# Patient Record
Sex: Male | Born: 1975 | Race: White | Marital: Married | Smoking: Never smoker
Health system: Southern US, Community
[De-identification: ages and names within clinical notes are randomized; demographics above are authoritative.]

## PROBLEM LIST (undated history)

## (undated) HISTORY — PX: KNEE ARTHROSCOPY W/ ACL RECONSTRUCTION: SHX1858

---

## 2013-09-03 ENCOUNTER — Ambulatory Visit (INDEPENDENT_AMBULATORY_CARE_PROVIDER_SITE_OTHER): Payer: BC Managed Care – PPO | Admitting: Family Medicine

## 2013-09-03 ENCOUNTER — Encounter: Payer: Self-pay | Admitting: Family Medicine

## 2013-09-03 ENCOUNTER — Ambulatory Visit (HOSPITAL_BASED_OUTPATIENT_CLINIC_OR_DEPARTMENT_OTHER)
Admission: RE | Admit: 2013-09-03 | Discharge: 2013-09-03 | Disposition: A | Discharge status: Home / Self Care | Payer: BC Managed Care – PPO | Source: Ambulatory Visit | Attending: Family Medicine | Admitting: Family Medicine

## 2013-09-03 VITALS — BP 121/66 | HR 58 | Ht 72.0 in | Wt 155.0 lb

## 2013-09-03 DIAGNOSIS — S8992XA Unspecified injury of left lower leg, initial encounter: Secondary | ICD-10-CM

## 2013-09-03 DIAGNOSIS — S8990XA Unspecified injury of unspecified lower leg, initial encounter: Secondary | ICD-10-CM

## 2013-09-03 DIAGNOSIS — S99929A Unspecified injury of unspecified foot, initial encounter: Secondary | ICD-10-CM

## 2013-09-03 DIAGNOSIS — S99919A Unspecified injury of unspecified ankle, initial encounter: Secondary | ICD-10-CM

## 2013-09-03 DIAGNOSIS — IMO0002 Reserved for concepts with insufficient information to code with codable children: Secondary | ICD-10-CM | POA: Insufficient documentation

## 2013-09-03 DIAGNOSIS — M1710 Unilateral primary osteoarthritis, unspecified knee: Secondary | ICD-10-CM | POA: Insufficient documentation

## 2013-09-03 NOTE — Patient Instructions (Signed)
I'm concerned about one of two possibilities: a loose body in your knee joint or a medial meniscus tear. We will go ahead with an MRI and I will contact you the business day following this to go over results. Icing 15 minutes at a time 3-4 times a day for pain and swelling. Elevate above the level of your heart as needed for swelling. Ibuprofen or aleve as needed for pain and inflammation. ACE wrap for support and compression.

## 2013-09-08 ENCOUNTER — Encounter: Payer: Self-pay | Admitting: Family Medicine

## 2013-09-08 DIAGNOSIS — S8992XA Unspecified injury of left lower leg, initial encounter: Secondary | ICD-10-CM | POA: Insufficient documentation

## 2013-09-08 NOTE — Assessment & Plan Note (Signed)
radiographs negative for fracture though on lateral can see a calcific body that may be within the knee joint.  Given effusion, normal ligamentous exam I am concerned this may be a loose body vs he has a large medial meniscus tear.  Will go ahead with MRI to further assess.  Elevation, icing, compression, nsaids in meantime.

## 2013-09-08 NOTE — Progress Notes (Addendum)
Patient ID: Charles Bright, male   DOB: 1976-03-05, 38 y.o.   MRN: [id number]  PCP: Charles JubileeZANARD, ROBYN, MD  Subjective:   HPI: Patient is a 38 y.o. male here for left knee injury.  Patient reports on 6/26 he was playing tennis. He stopped to hit a forehand and felt a pop/twinge in left knee. Stopped playing after this. A lot of swelling but no bruising. Has been icing. Had prior ACL tear in this knee - reconstruction about 8 years ago. Pain when walking, feels unstable. No catching or locking.  History reviewed. No pertinent past medical history.  No current outpatient prescriptions on file prior to visit.   No current facility-administered medications on file prior to visit.    Past Surgical History  Procedure Laterality Date  . Knee arthroscopy w/ acl reconstruction      No Known Allergies  History   Social History  . Marital Status: Married    Spouse Name: N/A    Number of Children: N/A  . Years of Education: N/A   Occupational History  . Not on file.   Social History Main Topics  . Smoking status: Never Smoker   . Smokeless tobacco: Not on file  . Alcohol Use: Not on file  . Drug Use: Not on file  . Sexual Activity: Not on file   Other Topics Concern  . Not on file   Social History Narrative  . No narrative on file    No family history on file.  BP 121/66  Pulse 58  Ht 6' (1.829 m)  Wt 155 lb (70.308 kg)  BMI 21.02 kg/m2  Review of Systems: See HPI above.    Objective:  Physical Exam:  Gen: NAD  Left knee: Moderate effusion.  No other deformity, no bruising. TTP medial joint line.  No other tenderness. Lacks a few degrees of extension, flexion to 100 degrees, painful. Negative ant/post drawers. Negative valgus/varus testing. Negative lachmanns. Mild pain with mcmurrays, thessalys.  Negative apleys, apleys, patellar apprehension. NV intact distally.    Assessment & Plan:  1. Left knee injury - radiographs negative for fracture though on  lateral can see a calcific body that may be within the knee joint.  Given effusion, normal ligamentous exam I am concerned this may be a loose body vs he has a large medial meniscus tear.  Will go ahead with MRI to further assess.  Elevation, icing, compression, nsaids in meantime.  Addendum:  MRI reviewed and discussed with patient.  He has a large bucket handle medial meniscus tear.  I think there's a very good chance he will not improve without surgical intervention.  Referring to ortho to discuss arthroscopy.

## 2013-09-10 ENCOUNTER — Ambulatory Visit (INDEPENDENT_AMBULATORY_CARE_PROVIDER_SITE_OTHER): Payer: BC Managed Care – PPO

## 2013-09-10 DIAGNOSIS — S8992XA Unspecified injury of left lower leg, initial encounter: Secondary | ICD-10-CM

## 2013-09-10 DIAGNOSIS — IMO0002 Reserved for concepts with insufficient information to code with codable children: Secondary | ICD-10-CM

## 2013-09-10 DIAGNOSIS — Y9369 Activity, other involving other sports and athletics played as a team or group: Secondary | ICD-10-CM

## 2013-09-12 ENCOUNTER — Other Ambulatory Visit: Payer: Self-pay | Admitting: *Deleted

## 2013-09-12 DIAGNOSIS — S83242A Other tear of medial meniscus, current injury, left knee, initial encounter: Secondary | ICD-10-CM

## 2015-05-13 IMAGING — CR DG KNEE COMPLETE 4+V*L*
4 series · 4 of 4 positions shown · non-contrast
Comparison: None.

CLINICAL DATA: Left knee injury. Left knee pain and joint effusion.

EXAM:
LEFT KNEE - COMPLETE 4+ VIEW

[t knee ap left]
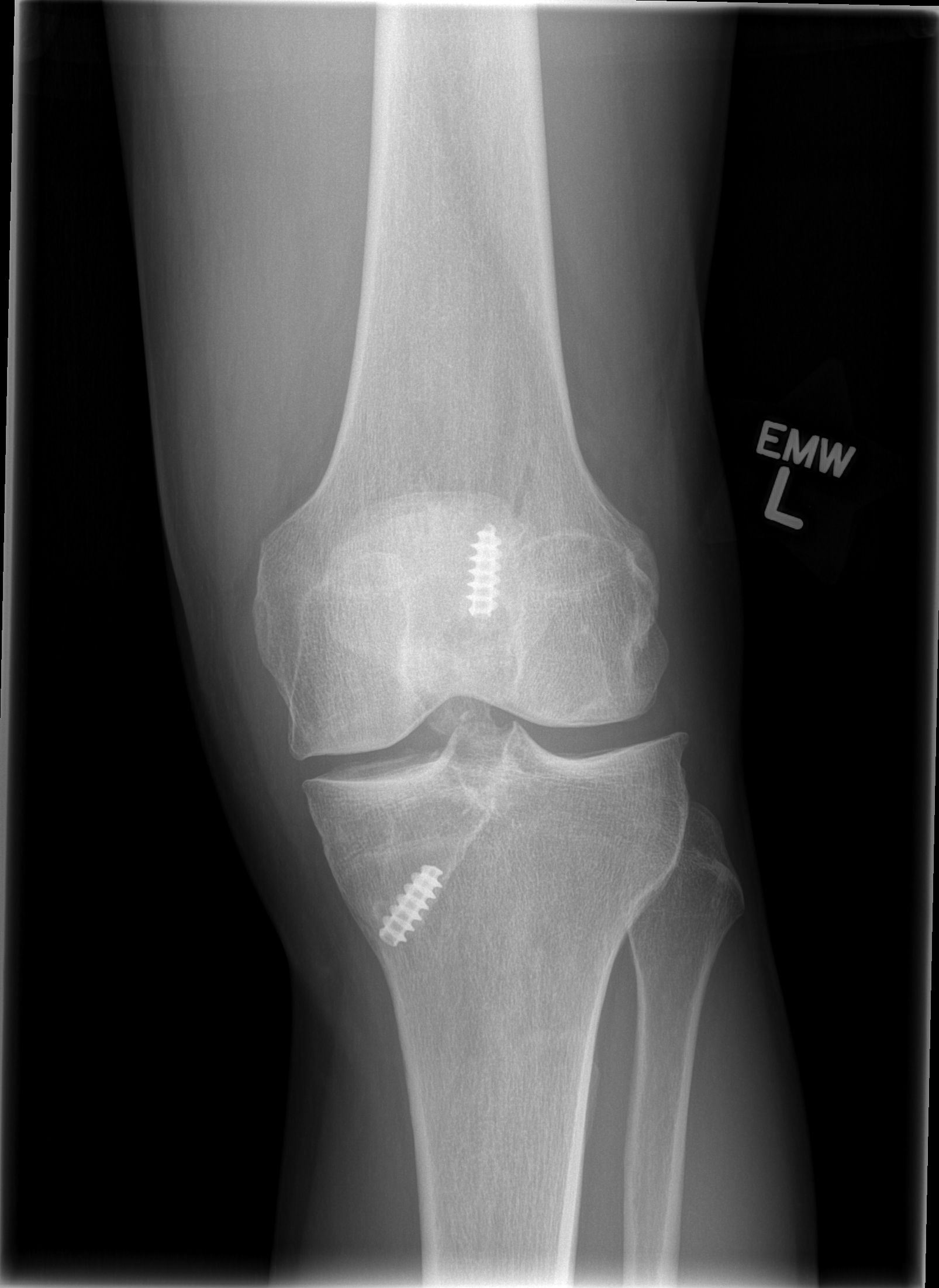

[t knee oblique left (1 of 2)]
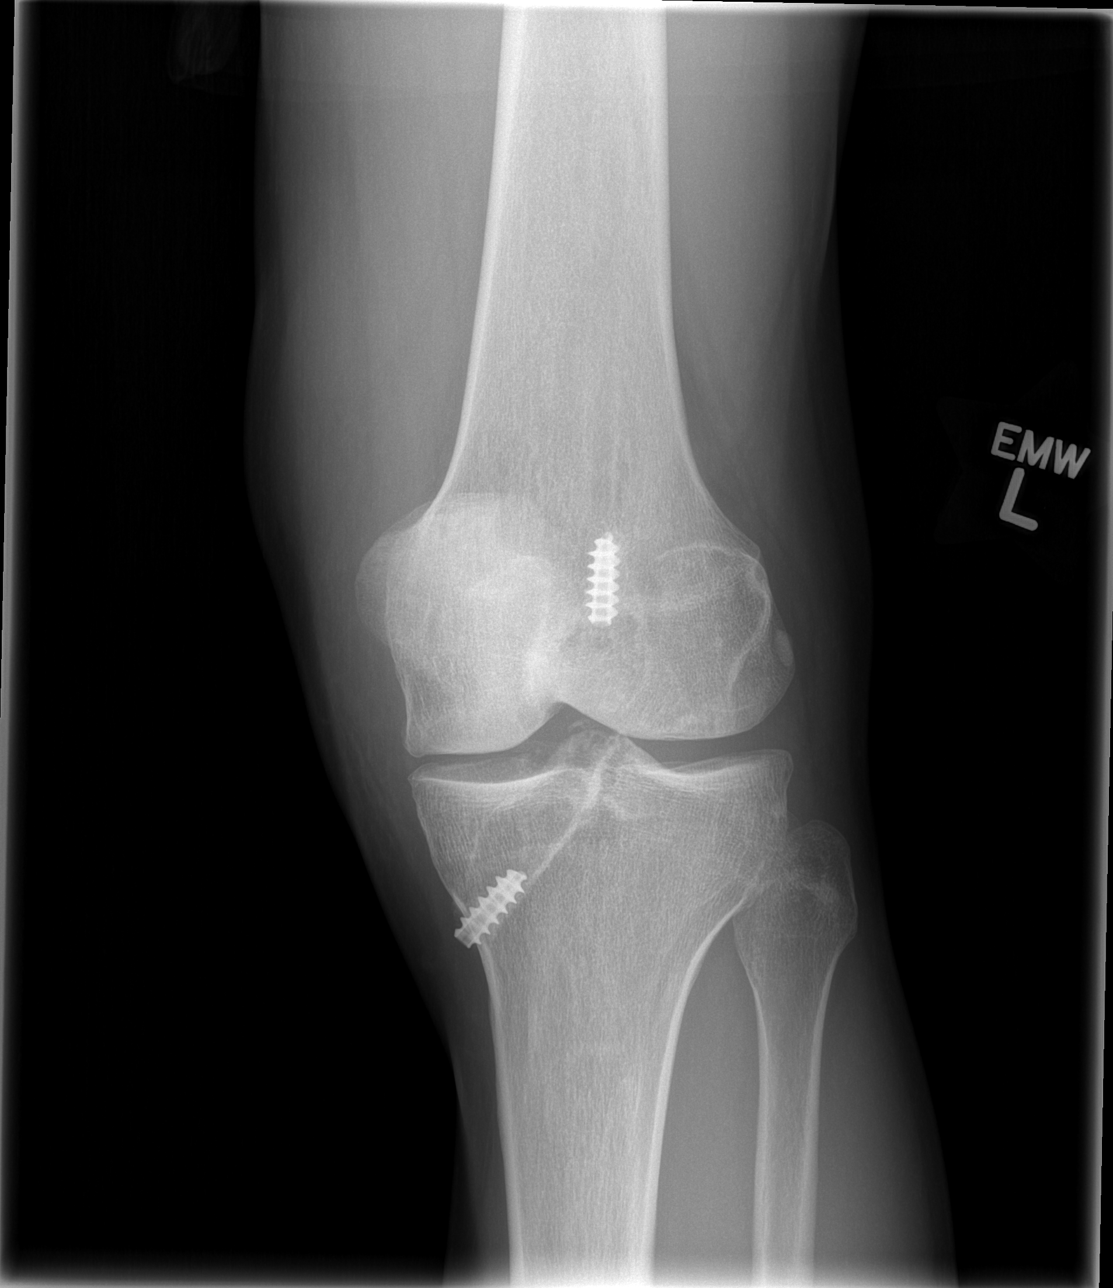

[t knee oblique left (2 of 2)]
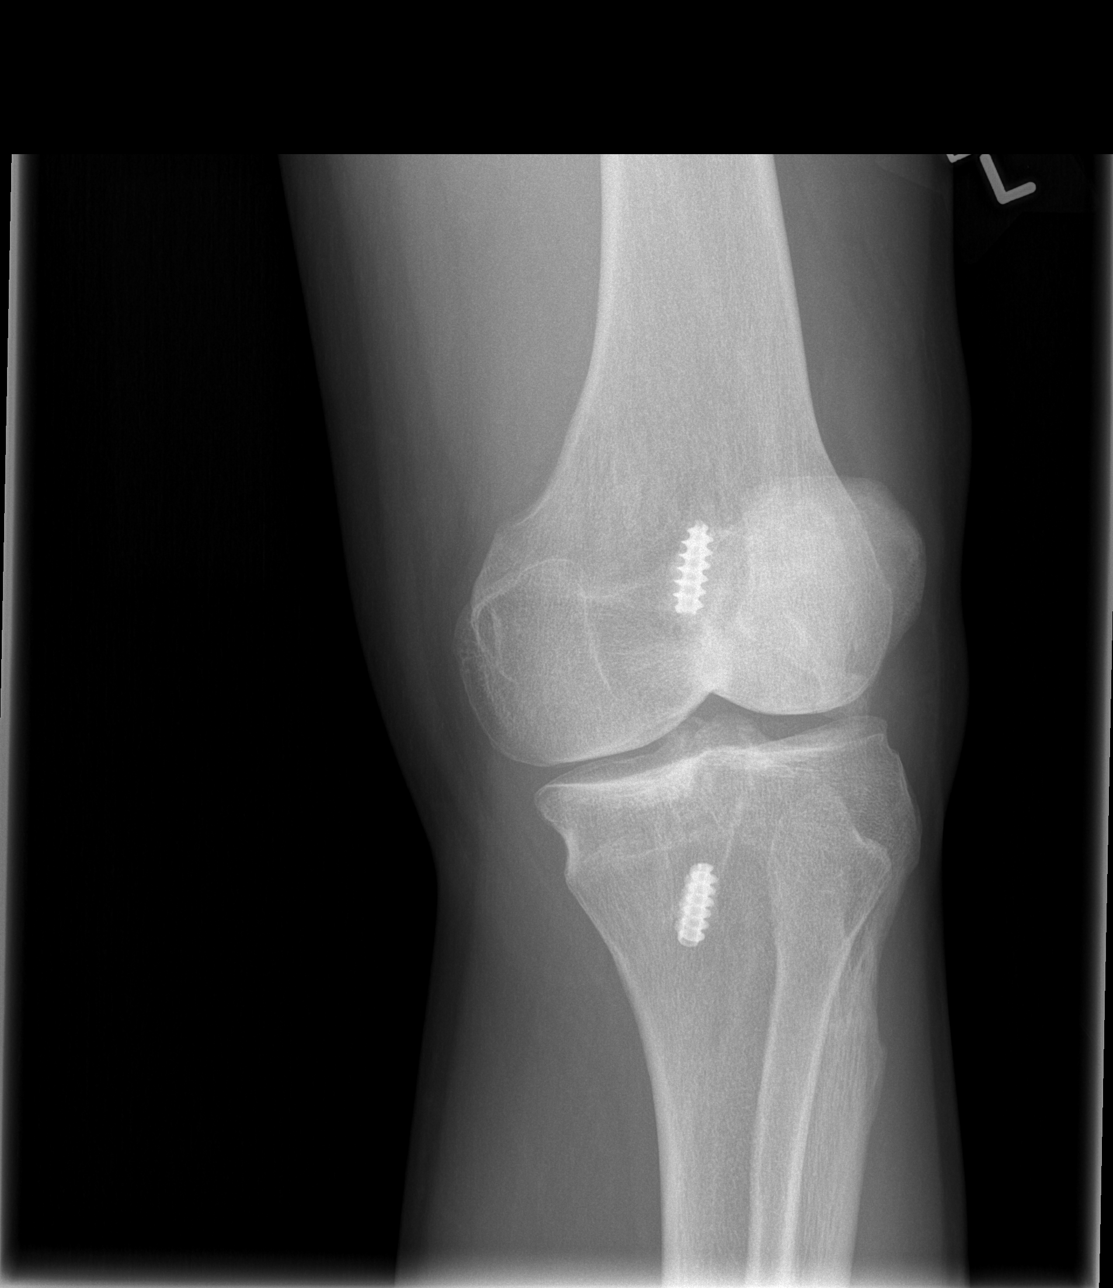

[t knee lat left]
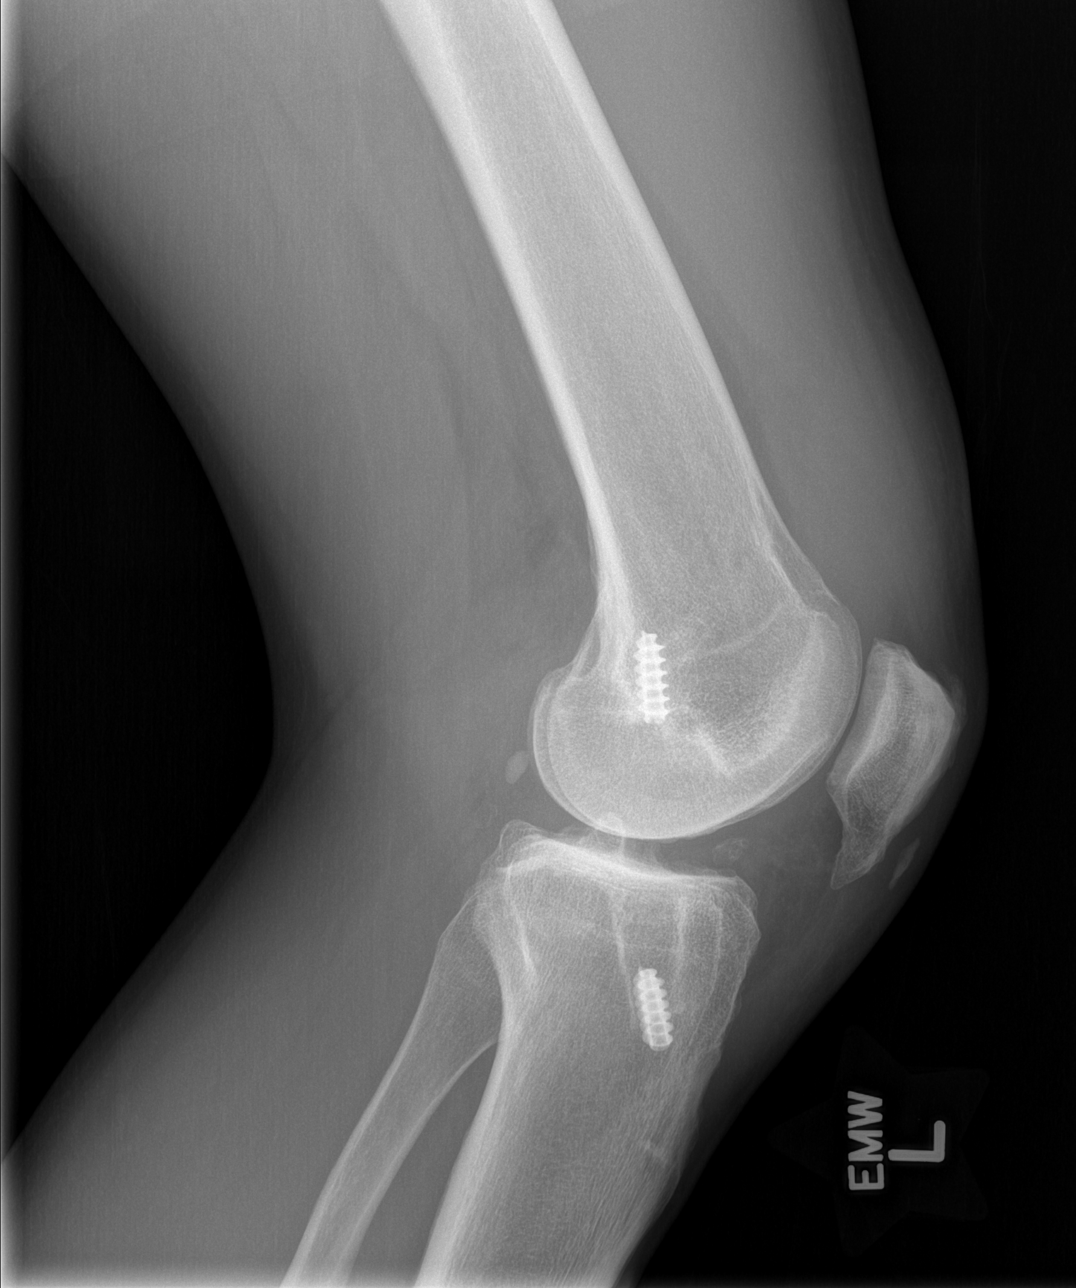

[4 of 4 positions shown; findings below may reference images not displayed]

FINDINGS: A large knee joint effusion is seen. Prior ACL repair noted. Mild
degenerative spurring of the medial and lateral compartments is seen
without significant joint space narrowing. Chronic traumatic or
enthesopathic changes seen involving the patella.
IMPRESSION: Large knee joint effusion.

No evidence of acute fracture or dislocation.

Mild osteoarthritis.
